# Patient Record
Sex: Male | Born: 1970 | Race: White | Hispanic: No | Smoking: Current every day smoker
Health system: Southern US, Community
[De-identification: ages and names within clinical notes are randomized; demographics above are authoritative.]

---

## 2001-07-20 ENCOUNTER — Encounter: Payer: Self-pay | Admitting: Emergency Medicine

## 2001-07-20 ENCOUNTER — Emergency Department (HOSPITAL_COMMUNITY): Admission: EM | Admit: 2001-07-20 | Discharge: 2001-07-20 | Payer: Self-pay | Admitting: Emergency Medicine

## 2016-05-28 ENCOUNTER — Encounter (HOSPITAL_COMMUNITY): Payer: Self-pay

## 2016-05-28 ENCOUNTER — Emergency Department (HOSPITAL_COMMUNITY)
Admission: EM | Admit: 2016-05-28 | Discharge: 2016-05-29 | Disposition: A | Discharge status: Home / Self Care | Payer: Self-pay | Attending: Emergency Medicine | Admitting: Emergency Medicine

## 2016-05-28 ENCOUNTER — Other Ambulatory Visit: Payer: Self-pay

## 2016-05-28 DIAGNOSIS — Z7982 Long term (current) use of aspirin: Secondary | ICD-10-CM | POA: Insufficient documentation

## 2016-05-28 DIAGNOSIS — R0789 Other chest pain: Secondary | ICD-10-CM | POA: Insufficient documentation

## 2016-05-28 DIAGNOSIS — F17210 Nicotine dependence, cigarettes, uncomplicated: Secondary | ICD-10-CM | POA: Insufficient documentation

## 2016-05-28 NOTE — ED Notes (Signed)
From home, has had intermittent CP x 3 days until tonight when CP became constant and sharp. Pt states the ate spicy food 2 hours prior to constant CP onset. Pt had 1 episode of vomiting after CP began. Denies sob, dizziness. Pt took 324 asa prior to EMS arrival. Pt refused IV with EMS. 12 lead shows elevation in v3 and v4. No medical hx. Doesn't take any meds. Allergic to pennicillin and codeine. Pt alert and oriented x 4.

## 2016-05-29 ENCOUNTER — Emergency Department (HOSPITAL_COMMUNITY): Payer: Self-pay

## 2016-05-29 LAB — CBC
HEMATOCRIT: 42.4 % (ref 39.0–52.0)
Hemoglobin: 14.7 g/dL (ref 13.0–17.0)
MCH: 30.4 pg (ref 26.0–34.0)
MCHC: 34.7 g/dL (ref 30.0–36.0)
MCV: 87.8 fL (ref 78.0–100.0)
Platelets: 255 10*3/uL (ref 150–400)
RBC: 4.83 MIL/uL (ref 4.22–5.81)
RDW: 12.7 % (ref 11.5–15.5)
WBC: 21.6 10*3/uL — ABNORMAL HIGH (ref 4.0–10.5)

## 2016-05-29 LAB — BASIC METABOLIC PANEL
Anion gap: 13 (ref 5–15)
BUN: 14 mg/dL (ref 6–20)
CALCIUM: 8.7 mg/dL — AB (ref 8.9–10.3)
CHLORIDE: 95 mmol/L — AB (ref 101–111)
CO2: 25 mmol/L (ref 22–32)
CREATININE: 0.91 mg/dL (ref 0.61–1.24)
GFR calc non Af Amer: >60 mL/min (ref >60)
Glucose, Bld: 102 mg/dL — ABNORMAL HIGH (ref 65–99)
Potassium: 3.3 mmol/L — ABNORMAL LOW (ref 3.5–5.1)
Sodium: 133 mmol/L — ABNORMAL LOW (ref 135–145)

## 2016-05-29 LAB — I-STAT TROPONIN, ED: Troponin i, poc: 0 ng/mL (ref 0.00–0.08)

## 2016-05-29 MED ORDER — RANITIDINE HCL 150 MG PO TABS: 150.0000 mg | ORAL_TABLET | Freq: Two times a day (BID) | ORAL | Status: AC

## 2016-05-29 NOTE — ED Provider Notes (Addendum)
CSN: [id number]     Arrival date & time 05/28/16  2335 History   By signing my name below, I, Jeff Dunlap, attest that this documentation has been prepared under the direction and in the presence of Jeff Dessert, MD.  Electronically Signed: Maud Deed. Royston Dunlap, ED Scribe. 05/29/2016. 12:23 AM.   Chief Complaint  Patient presents with  . Chest Pain   The history is provided by the patient. No language interpreter was used.    HPI Comments: Jeff Dunlap brought in by EMS is a 46 y.o. male with a PMHx of hiatal hernia who presents to the Emergency Department complaining of intermittent bilateral chest pain x several years but more consistent in last 3 days and worsened just prior to arrival while at rest. Pain is described sharp. Pt admits to eating some spicy food 2 hours prior to onset of pain. He also reports 1 episode of nausea and vomiting at time of discomfort. No aggravating or alleviating factors reported. Jeff Dunlap states "i took a pack of Tums and a hand full of ASA before EMS got to me". No recent fever, chills, shortness of breath, or dizziness. Pt admits he consumes alcohol daily and is an every day smoker. PMHx includes Prinzmetal's angina- father and sister, HTN, and diabetes.  PCP: No primary care provider on file.    History reviewed. No pertinent past medical history. History reviewed. No pertinent past surgical history. History reviewed. No pertinent family history. Social History  Substance Use Topics  . Smoking status: Current Every Day Smoker -- 2.00 packs/day    Types: Cigarettes  . Smokeless tobacco: None  . Alcohol Use: 3.0 oz/week    5 Cans of beer per week     Comment: I drink 5-6 beers per day    Review of Systems  Constitutional: Negative for fever and chills.  Respiratory: Negative for shortness of breath.   Cardiovascular: Positive for chest pain.  Gastrointestinal: Positive for vomiting.  Neurological: Negative for dizziness.   Psychiatric/Behavioral: Negative for confusion.  All other systems reviewed and are negative.     Allergies  Codeine and Penicillins  Home Medications   Prior to Admission medications   Medication Sig Start Date End Date Taking? Authorizing Provider  aspirin EC 81 MG tablet Take 81 mg by mouth every 6 (six) hours as needed (chest pain).   Yes Historical Provider, MD  calcium carbonate (TUMS - DOSED IN MG ELEMENTAL CALCIUM) 500 MG chewable tablet Chew 1-3 tablets by mouth daily as needed for indigestion or heartburn.   Yes Historical Provider, MD   Triage Vitals: BP 94/60 mmHg  Pulse 87  Temp(Src) 97.8 F (36.6 C) (Oral)  Resp 20  Ht 5' 7"  (1.702 m)  Wt 132 lb (59.875 kg)  BMI 20.67 kg/m2  SpO2 95%   Physical Exam  Constitutional: He is oriented to person, place, and time. He appears well-developed and well-nourished.  HENT:  Head: Normocephalic and atraumatic.  Eyes: EOM are normal.  Neck: Normal range of motion.  Cardiovascular: Normal rate, regular rhythm, normal heart sounds and intact distal pulses.   Pulmonary/Chest: Effort normal and breath sounds normal. No respiratory distress.  Abdominal: Soft. He exhibits no distension. There is no tenderness.  Musculoskeletal: Normal range of motion.  Neurological: He is alert and oriented to person, place, and time.  Skin: Skin is warm and dry.  Psychiatric: He has a normal mood and affect. Judgment normal.  Nursing note and vitals reviewed.   ED  Course  Procedures (including critical care time)  DIAGNOSTIC STUDIES: Oxygen Saturation is 95% on RA, adequate by my interpretation.    COORDINATION OF CARE: 12:14 AM- Will order imaging, blood work, and EKG. Discussed treatment plan with pt at bedside and pt agreed to plan.     Labs Review Labs Reviewed  BASIC METABOLIC PANEL - Abnormal; Notable for the following:    Sodium 133 (*)    Potassium 3.3 (*)    Chloride 95 (*)    Glucose, Bld 102 (*)    Calcium 8.7 (*)     All other components within normal limits  CBC - Abnormal; Notable for the following:    WBC 21.6 (*)    All other components within normal limits  I-STAT TROPOININ, ED    Imaging Review Dg Chest 2 View  05/29/2016  CLINICAL DATA:  45 year old male with chest pain and vomiting EXAM: CHEST  2 VIEW COMPARISON:  Chest radiograph dated 12/15/2014 FINDINGS: The heart size and mediastinal contours are within normal limits. Both lungs are clear. The visualized skeletal structures are unremarkable. IMPRESSION: No active cardiopulmonary disease. Electronically Signed   By: Anner Crete M.D.   On: 05/29/2016 00:32   I have personally reviewed and evaluated these images and lab results as part of my medical decision-making.   EKG Interpretation   Date/Time:  Friday May 28 2016 23:34:31 EDT Ventricular Rate:  87 PR Interval:  153 QRS Duration: 104 QT Interval:  350 QTC Calculation: 421 R Axis:   84 Text Interpretation:  Age not entered, assumed to be  45 years old for  purpose of ECG interpretation Sinus rhythm Probable left atrial  enlargement ST elev, probable normal early repol pattern No significant  change since last tracing Confirmed by Maryan Rued  MD, Loree Fee ([id number]) on  05/29/2016 12:26:03 AM      MDM   Final diagnoses:  Atypical chest pain    Patient is a 45 year old male with a history of tobacco and alcohol abuse presenting today with severe onset of chest pain approximately 1 hour prior to arrival. He states he's had intermittent chest pain since the age of 59 which she does not get too upset about. However today after eating [employer] proximal sling 1 hour later he laid down and got a sudden severe pain in both sides of his chest that did not radiate and caused him to vomit one time. He denied any abdominal pain, arm pain or leg pain during this event. He took Tums and aspirin and by the time he arrives tear he is symptom-free. He states he's been under a lot of stress recently  and has been eating out. He does have a history of a hiatal hernia and GERD but takes no medication for this.  On exam there are no acute findings. Vital signs are all within normal limits. Patient appears comfortable. Pulses are equal in all 4 extremities.  CBC, BMP, troponin, chest x-ray and EKG done. CBC within nonspecific leukocytosis of 21,000. Hemoglobin, be met and troponin are all within normal limits. Chest x-ray without evidence of pneumothorax or widened mediastinum. EKG with early repolarization but no other acute findings.  On reevaluation patient is still symptom-free. Discussed second troponin to ensure no evidence of ACS as the cause of his pain however patient is refusing this at this time and requesting to go home. He has had multiple stress tests in the past for chest pain the most recent about 4 years ago and states that  he will follow-up or return if symptoms worsen.  I personally performed the services described in this documentation, which was scribed in my presence.  The recorded information has been reviewed and considered.    Jeff Dessert, MD 05/29/16 0120  Jeff Dessert, MD 05/29/16 2824

## 2018-04-27 IMAGING — CR DG CHEST 2V
2 series · 2 of 2 positions shown · non-contrast
Comparison: Chest radiograph dated 12/15/2014

CLINICAL DATA: 45-year-old male with chest pain and vomiting

EXAM:
CHEST  2 VIEW

[chest pa]
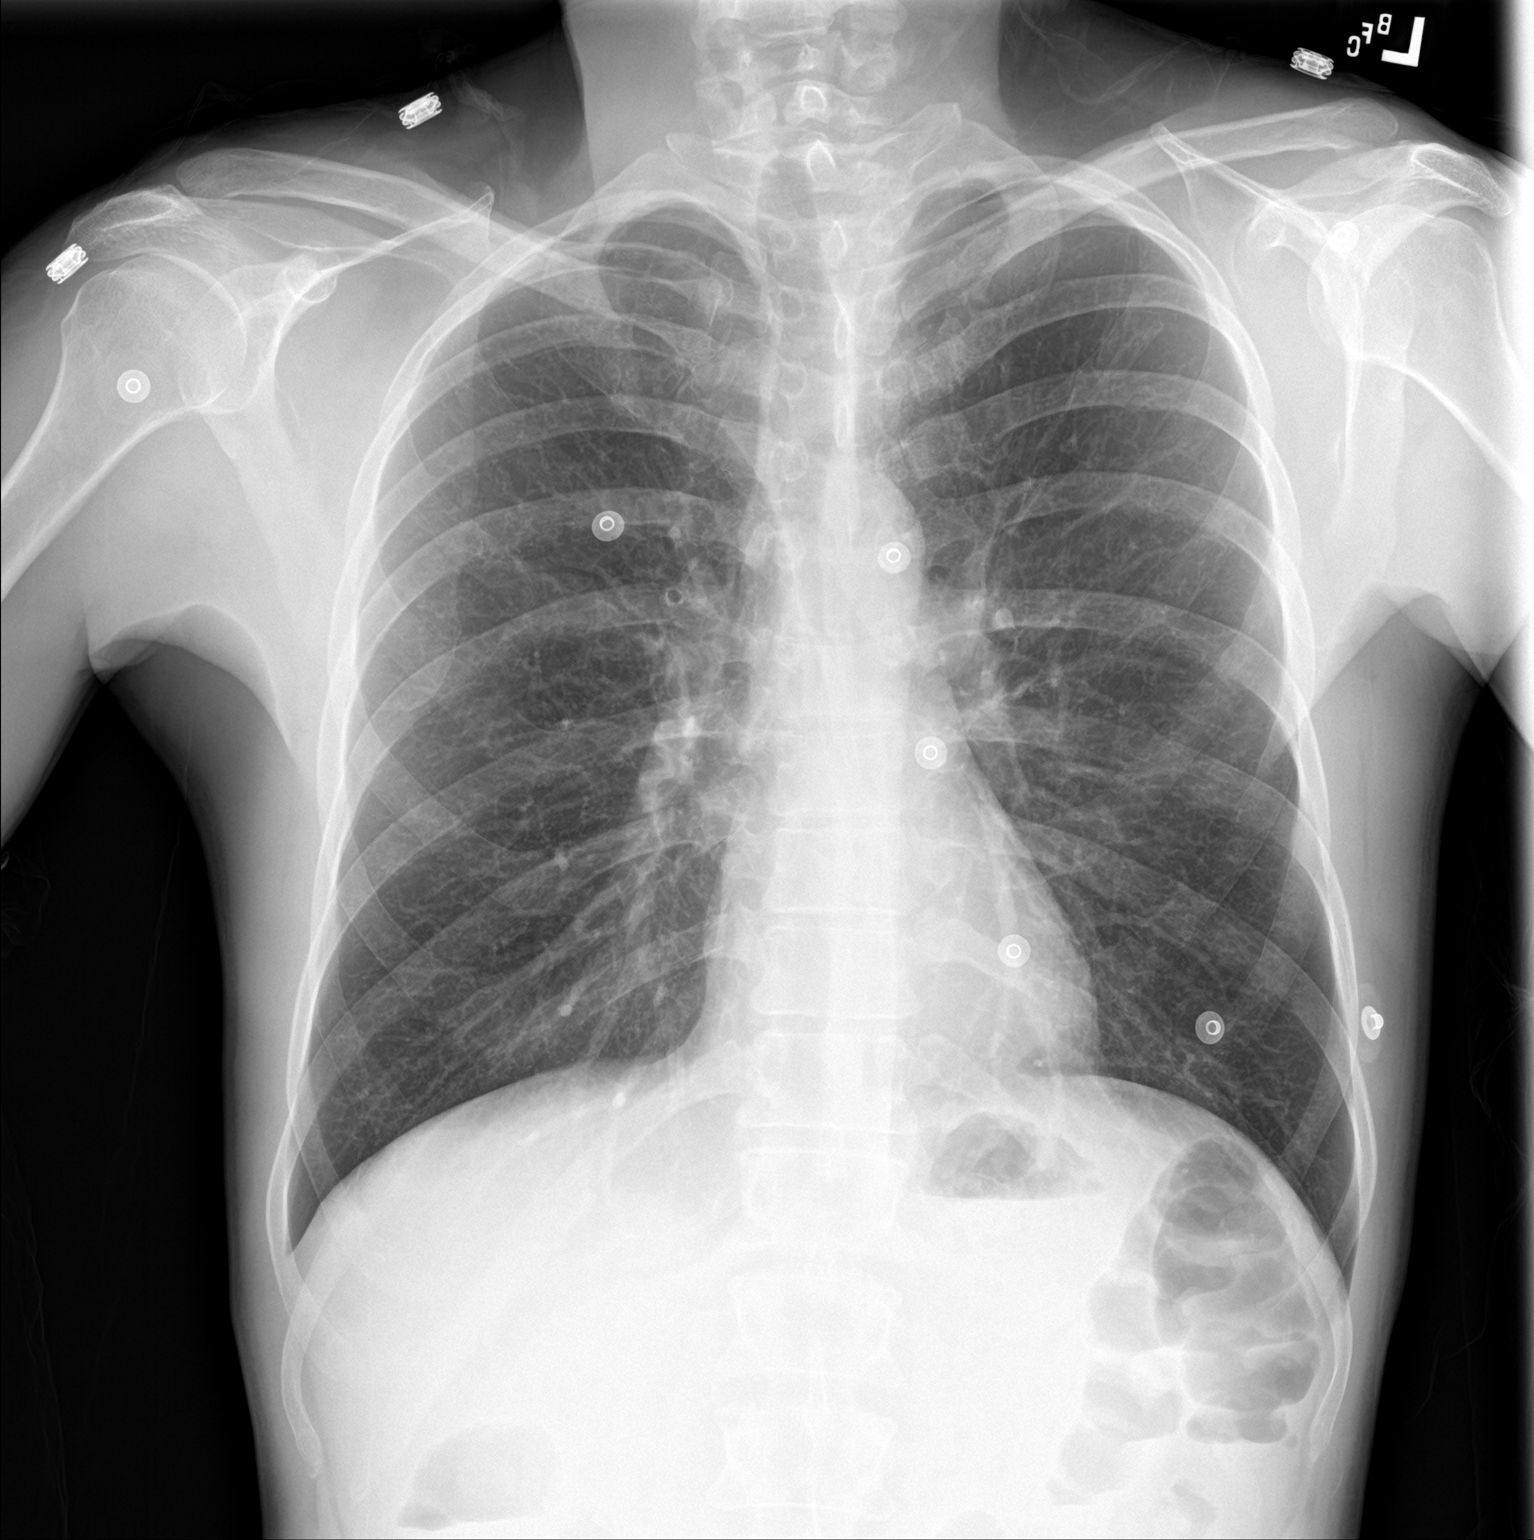

[chest lat]
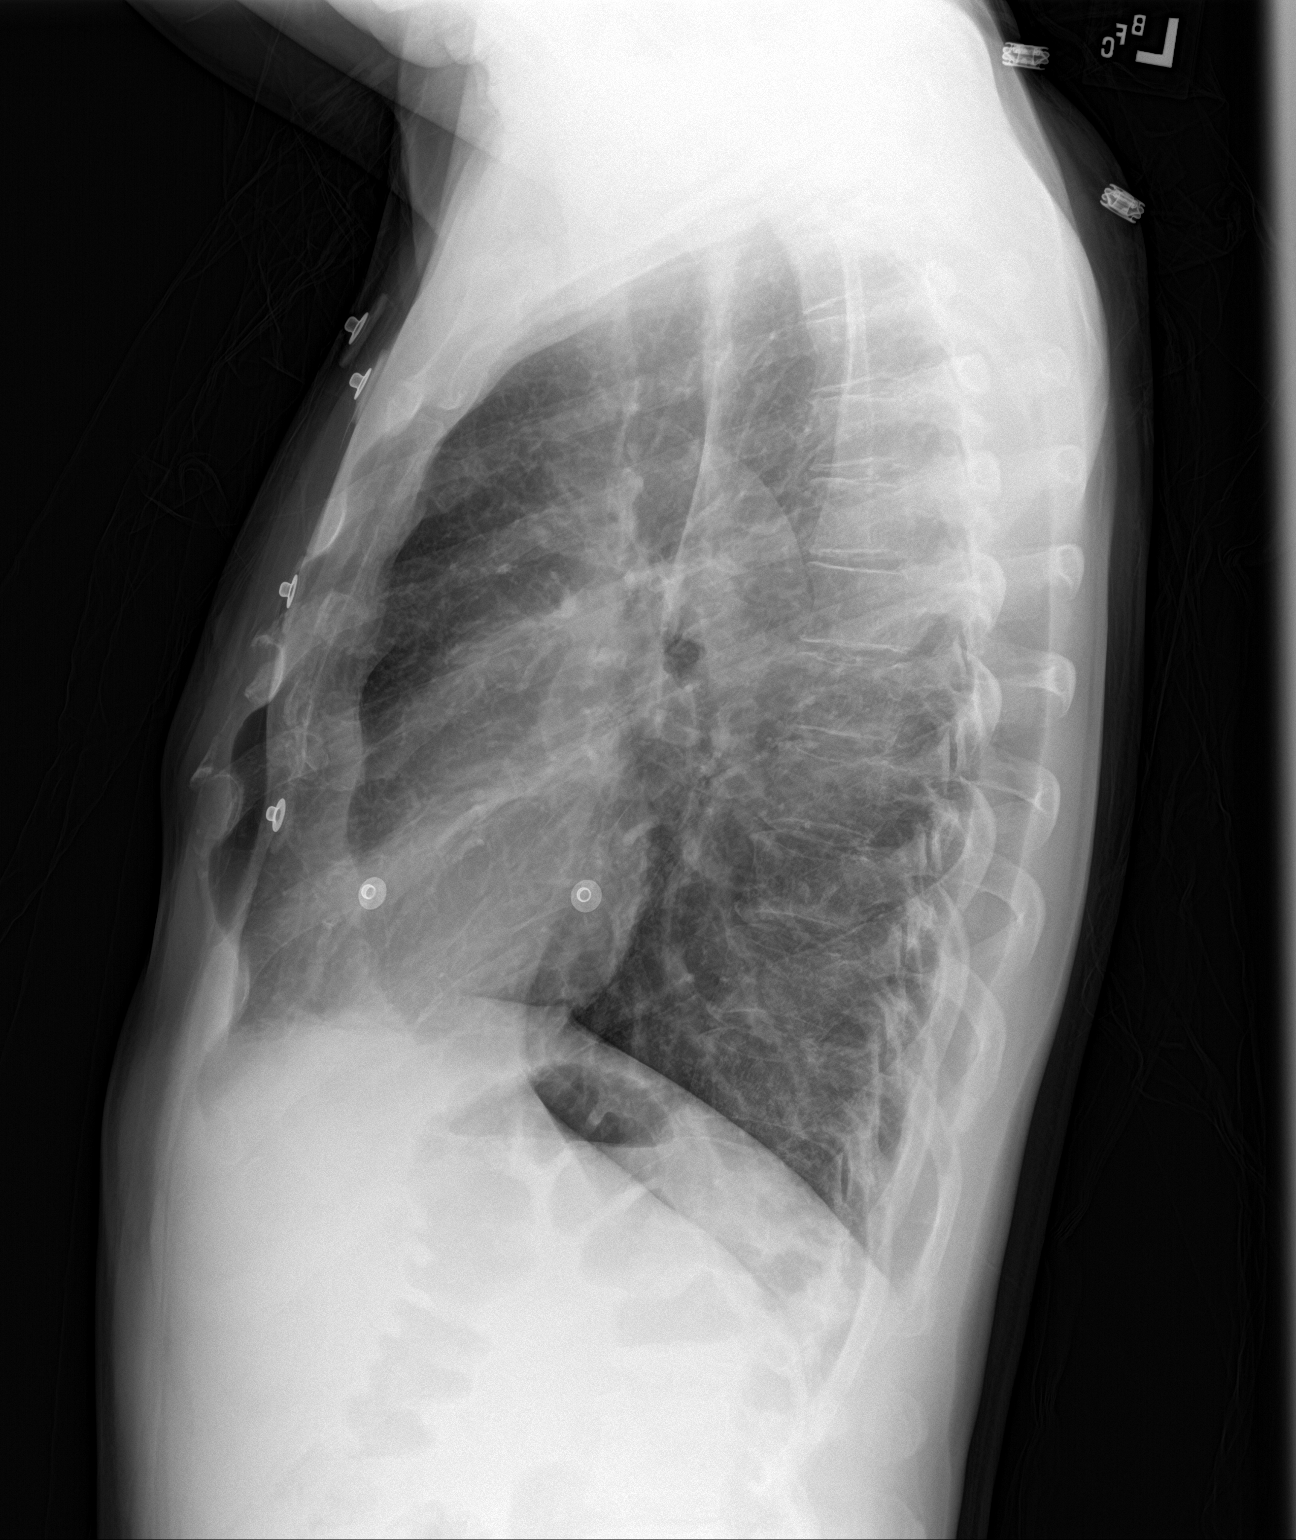

[2 of 2 positions shown; findings below may reference images not displayed]

FINDINGS: The heart size and mediastinal contours are within normal limits.
Both lungs are clear. The visualized skeletal structures are
unremarkable.
IMPRESSION: No active cardiopulmonary disease.

## 2022-09-07 ENCOUNTER — Ambulatory Visit: Payer: Self-pay | Admitting: Physician Assistant
